# Patient Record
Sex: Female | Born: 1985 | Race: White | Hispanic: No | Marital: Married | Smoking: Former smoker
Health system: Southern US, Community
[De-identification: ages and names within clinical notes are randomized; demographics above are authoritative.]

## PROBLEM LIST (undated history)

## (undated) ENCOUNTER — Inpatient Hospital Stay (HOSPITAL_COMMUNITY): Payer: Self-pay

## (undated) DIAGNOSIS — L03213 Periorbital cellulitis: Secondary | ICD-10-CM

## (undated) DIAGNOSIS — T7840XA Allergy, unspecified, initial encounter: Secondary | ICD-10-CM

## (undated) DIAGNOSIS — R519 Headache, unspecified: Secondary | ICD-10-CM

## (undated) DIAGNOSIS — R51 Headache: Secondary | ICD-10-CM

## (undated) DIAGNOSIS — K589 Irritable bowel syndrome without diarrhea: Secondary | ICD-10-CM

## (undated) DIAGNOSIS — L509 Urticaria, unspecified: Secondary | ICD-10-CM

## (undated) DIAGNOSIS — F419 Anxiety disorder, unspecified: Secondary | ICD-10-CM

## (undated) DIAGNOSIS — K219 Gastro-esophageal reflux disease without esophagitis: Secondary | ICD-10-CM

## (undated) DIAGNOSIS — R569 Unspecified convulsions: Secondary | ICD-10-CM

## (undated) DIAGNOSIS — F329 Major depressive disorder, single episode, unspecified: Secondary | ICD-10-CM

## (undated) DIAGNOSIS — F32A Depression, unspecified: Secondary | ICD-10-CM

## (undated) DIAGNOSIS — G8929 Other chronic pain: Secondary | ICD-10-CM

## (undated) HISTORY — DX: Periorbital cellulitis: L03.213

## (undated) HISTORY — PX: INTRAUTERINE DEVICE INSERTION: SHX323

## (undated) HISTORY — DX: Irritable bowel syndrome without diarrhea: K58.9

## (undated) HISTORY — DX: Other chronic pain: G89.29

## (undated) HISTORY — DX: Headache, unspecified: R51.9

## (undated) HISTORY — DX: Gastro-esophageal reflux disease without esophagitis: K21.9

## (undated) HISTORY — DX: Allergy, unspecified, initial encounter: T78.40XA

## (undated) HISTORY — DX: Headache: R51

## (undated) HISTORY — PX: BREAST ENHANCEMENT SURGERY: SHX7

## (undated) HISTORY — DX: Urticaria, unspecified: L50.9

---

## 2016-08-01 ENCOUNTER — Inpatient Hospital Stay (HOSPITAL_COMMUNITY)
Admission: AD | Admit: 2016-08-01 | Discharge: 2016-08-01 | Disposition: A | Discharge status: Home / Self Care | Payer: Medicaid Other | Source: Ambulatory Visit | Attending: Obstetrics | Admitting: Obstetrics

## 2016-08-01 ENCOUNTER — Encounter (HOSPITAL_COMMUNITY): Payer: Self-pay | Admitting: *Deleted

## 2016-08-01 DIAGNOSIS — O26899 Other specified pregnancy related conditions, unspecified trimester: Secondary | ICD-10-CM

## 2016-08-01 DIAGNOSIS — O26892 Other specified pregnancy related conditions, second trimester: Secondary | ICD-10-CM | POA: Diagnosis not present

## 2016-08-01 DIAGNOSIS — Z87891 Personal history of nicotine dependence: Secondary | ICD-10-CM | POA: Diagnosis not present

## 2016-08-01 DIAGNOSIS — O30032 Twin pregnancy, monochorionic/diamniotic, second trimester: Secondary | ICD-10-CM | POA: Diagnosis not present

## 2016-08-01 DIAGNOSIS — R1030 Lower abdominal pain, unspecified: Secondary | ICD-10-CM | POA: Diagnosis present

## 2016-08-01 DIAGNOSIS — O368120 Decreased fetal movements, second trimester, not applicable or unspecified: Secondary | ICD-10-CM | POA: Insufficient documentation

## 2016-08-01 DIAGNOSIS — R109 Unspecified abdominal pain: Secondary | ICD-10-CM

## 2016-08-01 DIAGNOSIS — O26891 Other specified pregnancy related conditions, first trimester: Secondary | ICD-10-CM

## 2016-08-01 DIAGNOSIS — Z3A17 17 weeks gestation of pregnancy: Secondary | ICD-10-CM | POA: Insufficient documentation

## 2016-08-01 DIAGNOSIS — O219 Vomiting of pregnancy, unspecified: Secondary | ICD-10-CM | POA: Diagnosis not present

## 2016-08-01 DIAGNOSIS — R102 Pelvic and perineal pain: Secondary | ICD-10-CM | POA: Diagnosis not present

## 2016-08-01 HISTORY — DX: Unspecified convulsions: R56.9

## 2016-08-01 HISTORY — DX: Major depressive disorder, single episode, unspecified: F32.9

## 2016-08-01 HISTORY — DX: Anxiety disorder, unspecified: F41.9

## 2016-08-01 HISTORY — DX: Depression, unspecified: F32.A

## 2016-08-01 LAB — URINALYSIS, ROUTINE W REFLEX MICROSCOPIC
Bilirubin Urine: NEGATIVE
Glucose, UA: NEGATIVE mg/dL
Hgb urine dipstick: NEGATIVE
Ketones, ur: NEGATIVE mg/dL
LEUKOCYTES UA: NEGATIVE
NITRITE: NEGATIVE
Protein, ur: NEGATIVE mg/dL
SPECIFIC GRAVITY, URINE: 1.025 (ref 1.005–1.030)
pH: 6 (ref 5.0–8.0)

## 2016-08-01 NOTE — Discharge Instructions (Signed)

## 2016-08-01 NOTE — MAU Provider Note (Signed)
History     CSN: [id number]  Arrival date and time: 08/01/16 1409   First Provider Initiated Contact with Patient 08/01/16 1505      Chief Complaint  Patient presents with  . Abdominal Pain  . Decreased Fetal Movement   HPI Gabrielle Young is 30 y.o. G3P2002 [redacted]w[redacted]d weeks presenting with lower abdominal cramping X 4 days.  The pain is in the lower quadrant, more on the right and with movement.  She is concerned something may be wrong with the fetus on her right.  Neg for abnormal vaginal discharge and bleeding.  Decreased fetal movement.  Has  Mono-Di twin pregnancy. Has anatomy scan schedule for next Monday. She is a patient of Gabrielle Young.  Has been seen once, late to care because of move.     Past Medical History:  Diagnosis Date  . Anxiety   . Depression   . 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 BREAST ENHANCEMENT SURGERY      History reviewed. No pertinent family history.  Social History  Substance Use Topics  . Smoking status: Former Research scientist (life sciences)  . Smokeless tobacco: Never Used  . Alcohol use No    Allergies: Allergies not on file  No prescriptions prior to admission.    Review of Systems  Constitutional: Negative for chills and fever.  Gastrointestinal: Positive for abdominal pain (decreased fetal movement. ), nausea and vomiting.  Genitourinary: Negative for dysuria, frequency, hematuria and urgency.       Negative for vaginal bleeding or abnormal discharge.   Neurological: Negative for headaches.   Physical Exam   Blood pressure 102/72, pulse 84, temperature 98.3 F (36.8 C), resp. rate 16, weight 157 lb (71.2 kg), last menstrual period 03/31/2016.  Physical Exam  Constitutional: She is oriented to person, place, and time. She appears well-developed and well-nourished.  HENT:  Head: Normocephalic.  Cardiovascular: Normal rate.   Respiratory: Effort normal.  GI: There is tenderness (bilaterally in the  area of the round ligaments).  Genitourinary: There is no rash, tenderness or lesion on the right labia. There is no rash, tenderness or lesion on the left labia. Uterus is enlarged (19 week size). Uterus is not tender. Cervix exhibits motion tenderness, discharge and friability. No bleeding in the vagina. No vaginal discharge found.  Neurological: She is alert and oriented to person, place, and time.  Skin: Skin is warm and dry.  Psychiatric: She has a normal mood and affect. Her behavior is normal. Thought content normal.   Results for orders placed or performed during the hospital encounter of 08/01/16 (from the past 24 hour(s))  Urinalysis, Routine w reflex microscopic (not at [facility])     Status: Abnormal   Collection Time: 08/01/16  2:30 PM  Result Value Ref Range   Color, Urine YELLOW YELLOW   APPearance HAZY (A) CLEAR   Specific Gravity, Urine 1.025 1.005 - 1.030   pH 6.0 5.0 - 8.0   Glucose, UA NEGATIVE NEGATIVE mg/dL   Hgb urine dipstick NEGATIVE NEGATIVE   Bilirubin Urine NEGATIVE NEGATIVE   Ketones, ur NEGATIVE NEGATIVE mg/dL   Protein, ur NEGATIVE NEGATIVE mg/dL   Nitrite NEGATIVE NEGATIVE   Leukocytes, UA NEGATIVE NEGATIVE    MAU Course  Procedures  MDM MSE Exam Reported MSE, labs and physical findings.  Dr. Carlis Abbott ordered po challenge.  If no vomiting, continue to encourage Diclegis for nausea.  If she does not keep down fluids, wants  to be called. Care turned over to J. Rasch, NP at 16:05  Assessment and Plan  A: Decreased fetal movement     Twin gestation at [redacted]w[redacted]d     Cramping in second trimester     Round ligament pain    P:  Keep scheduled appt for next week       Patient passed PO challenge in MAU.        Encouraged diclegis for Nausea symptoms   KEY,EVE M 08/01/2016, 3:05 PM   Lezlie Lye, NP 08/01/2016 4:52 PM

## 2016-08-01 NOTE — MAU Note (Signed)
Pt presents to MAU with complaints of lower abdominal cramping since Thursday night. Denies and vaginal bleeding or abnormal discharge. PT states that she has started feeling movement and noted a decrease and is concerned. PNC started late due to moving to [place]. TWIN gestation.

## 2016-08-15 ENCOUNTER — Other Ambulatory Visit (HOSPITAL_COMMUNITY): Payer: Self-pay | Admitting: Obstetrics and Gynecology

## 2016-08-15 DIAGNOSIS — IMO0001 Reserved for inherently not codable concepts without codable children: Secondary | ICD-10-CM

## 2016-08-15 DIAGNOSIS — Z3A20 20 weeks gestation of pregnancy: Secondary | ICD-10-CM

## 2016-08-15 DIAGNOSIS — Z3689 Encounter for other specified antenatal screening: Secondary | ICD-10-CM

## 2016-08-17 ENCOUNTER — Encounter (HOSPITAL_COMMUNITY): Payer: Self-pay | Admitting: Obstetrics and Gynecology

## 2016-08-26 ENCOUNTER — Ambulatory Visit (HOSPITAL_COMMUNITY)
Admission: RE | Admit: 2016-08-26 | Discharge: 2016-08-26 | Disposition: A | Discharge status: Home / Self Care | Payer: Medicaid Other | Source: Ambulatory Visit | Attending: Obstetrics and Gynecology | Admitting: Obstetrics and Gynecology

## 2016-08-26 ENCOUNTER — Other Ambulatory Visit (HOSPITAL_COMMUNITY): Payer: Self-pay | Admitting: Obstetrics and Gynecology

## 2016-08-26 ENCOUNTER — Encounter (HOSPITAL_COMMUNITY): Payer: Self-pay

## 2016-08-26 DIAGNOSIS — Z315 Encounter for genetic counseling: Secondary | ICD-10-CM | POA: Insufficient documentation

## 2016-08-26 DIAGNOSIS — Q6602 Congenital talipes equinovarus, left foot: Secondary | ICD-10-CM

## 2016-08-26 DIAGNOSIS — O358XX0 Maternal care for other (suspected) fetal abnormality and damage, not applicable or unspecified: Secondary | ICD-10-CM | POA: Diagnosis not present

## 2016-08-26 DIAGNOSIS — IMO0001 Reserved for inherently not codable concepts without codable children: Secondary | ICD-10-CM

## 2016-08-26 DIAGNOSIS — Z3A20 20 weeks gestation of pregnancy: Secondary | ICD-10-CM

## 2016-08-26 DIAGNOSIS — O30032 Twin pregnancy, monochorionic/diamniotic, second trimester: Secondary | ICD-10-CM | POA: Diagnosis not present

## 2016-08-26 DIAGNOSIS — O358XX1 Maternal care for other (suspected) fetal abnormality and damage, fetus 1: Secondary | ICD-10-CM

## 2016-08-26 DIAGNOSIS — O35HXX0 Maternal care for other (suspected) fetal abnormality and damage, fetal lower extremities anomalies, not applicable or unspecified: Secondary | ICD-10-CM | POA: Insufficient documentation

## 2016-08-26 DIAGNOSIS — Z3A21 21 weeks gestation of pregnancy: Secondary | ICD-10-CM | POA: Insufficient documentation

## 2016-08-26 DIAGNOSIS — O35HXX1 Maternal care for other (suspected) fetal abnormality and damage, fetal lower extremities anomalies, fetus 1: Secondary | ICD-10-CM

## 2016-08-26 DIAGNOSIS — Z3689 Encounter for other specified antenatal screening: Secondary | ICD-10-CM

## 2016-08-26 DIAGNOSIS — Q6601 Congenital talipes equinovarus, right foot: Secondary | ICD-10-CM | POA: Insufficient documentation

## 2016-08-26 NOTE — Progress Notes (Signed)
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 She was sent for confirmatory Korea, genetic counseling and consult. Pregnancy has been unremarkable so far. Reports good fetal movement from both twins. There is no history of twin or distal extremity anomalies on either side of the family.   OB History: OB History    Gravida Para Term Preterm AB Living   3 2 2     2    SAB TAB Ectopic Multiple Live Births           2      PMH:  Past Medical History:  Diagnosis Date  . Anxiety   . Depression   . 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 BREAST ENHANCEMENT SURGERY     Meds: PNV, diclegis Allergies: NKDA Fh: Noncontributory Soc: Former smoker, no ETOH or illicit drug use  Review of Systems: no vaginal bleeding or cramping/contractions, no LOF, no nausea/vomiting. All other systems reviewed and are negative.  PE: see EPIC section for PE and vitals  Please see separate document for fetal ultrasound report.  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 Further, twin B has mild unilateral pyelectasis (33mm on the right). I cannot unequivocally confirm monochorionic implantation today. I cannot see a "T" sign or a "lambda" sign at the membrane junction. There is a moderately thick membrane. Both twins are boys and the placental mass appears single or possibly  fused. I am assuming her first Korea in your office was more diagnostic than mine, so we will proceed with the assumption that this is a mono/di twin set. To that end, in addition to the "routine" enhanced surveillance  provided for all twin pregnancies. she needs [place] evaluation every 2 weeks to assess for twin-to-twin transfusion. There should be growth evaluation every 4 weeks. On today's scan there is no evidence of TTTS. The pyelectasis should be reevaluated during the course of the pregnancy. A 74mm measurement in the third trimester should prompt referral to pediatric urology. Due to the equinovarus deformity, this couple could also benefit from a third trimester predelivery consult with a pediatric orthopedist.  It should be noted her cervical length today is normal. A repeat evaluation between 24 and 26 weeks is warranted.  Genetic counseling report is provided separately, but in summary she is going to obtain NIPT for aneuploidy and expanded carrier screening  Please let us know if you would like Korea to continue our Korea evaluations for TTTS. If so, please make this patient an appointment for 2 weeks from now in the MFM center.   It is my understanding The patient is moving to [place] within the month and will be transferring her care to a [facility] provider. I let them know that [facility] has EPIC EMR and they will be able to access our reports via Care Anywhere  Thank you for the opportunity to be a part of the care of Gabrielle Young. Please contact our office if we can be of further assistance.   I spent approximately 40 minutes with this patient with over 50% of time spent in face-to-face counseling.

## 2016-08-26 NOTE — Progress Notes (Signed)
Genetic Counseling  High-Risk Gestation Note  Appointment Date:  08/26/2016 Referred By: Gabrielle Kenner, DO Date of Birth:  1986-07-31 Partner: Gabrielle Young   Pregnancy History: [id number] Estimated Date of Delivery: 01/05/17 Estimated Gestational Age: 35w1dAttending: MGriffin Dakin MD  I met with [name] her partner, Mr. Gabrielle Young for genetic counseling because of the ultrasound finding of bilateral club foot for twin B in monochorionic diamniotic twin gestation.   In summary:  Discussed ultrasound findings in detail: bilateral club foot in twin A (formerly was twin B on previous exams, have switched places) and right sided urinary tract dilation (pyelectasis) in twin B  Reviewed options for additional screening  NIPS-elected to pursue today (Teacher, English as a foreign language  Expanded carrier screening - elected to pursue today ([place])  Ongoing ultrasound  Reviewed options for diagnostic testing, including risks, benefits, limitations and alternatives  Declined amniocentesis  Reviewed other explanations for ultrasound findings  Reviewed family history concerns  We began by reviewing the ultrasound in detail. Ultrasound performed today visualized monochorionic diamniotic twin gestation. Twin A was visualized to have bilateral club foot. Twin B was visualized to have right sided urinary tract dilation (pyelectasis), measuring 6 mm. All remaining visualized fetal anatomy was within normal limits. Normal amniotic fluid was visualized. Complete ultrasound results reported under separate cover.   We discussed that clubfoot/feet is a term that actually describes three distinct anomalies (talipes equinovarus, talipes calcaneovalgus, and metatarsus varus) and occurs in 1 in 1000 births. The most common type of clubfeet, talipes equinovarus, is characterized by forefoot adduction with supination, heel varus, and ankle equines, which cannot be brought back to a neutral position. They were  counseled that clubfeet can be an isolated difference, occur as a feature of an underlying syndrome, or the result of neurological impairment. We discussed that the most likely mode of inheritance for nonsyndromic, isolated clubfoot/feet is multifactorial. There is a known genetic component for multifactorial clubfoot, as demonstrated by twin studies; environmental factors also play a role, including infection, drugs, and intrauterine environment (oligohydramnios, fetal positioning). While the majority of cases of clubfoot/feet are isolated, it is a feature in more than 200 known genetic syndromes, including both chromosomal and single gene conditions. We reviewed chromosomes, nondisjunction and the features of Down syndrome, trisomy 161 and 11 We discussed that the risk for other chromosome aberrations is slightly increased (microdeletions, microduplications, insertions, translocations). We reviewed single gene conditions including common inheritance patterns and associated risks for recurrence. We reviewed the normal results of Gabrielle Young's Quad screen and the associated reduction in risks for fetal Down syndrome and open neural tube defects. We reviewed the limitations of Quad screening and the decreased sensitivity in a twin gestation.   Fetal urinary tract dilation (pyelectasis) is defined as the dilatation of the fetal renal pelvis/pelvises due to excess urine. This finding is estimated to occur in 2-3% of fetuses.  The female to female ratio is 2:1.  Typically, babies with mild pyelectasis are born normal and healthy and we are usually unable to determine why this extra fluid is present.  This urine accumulation may regress, stay the same or continue to accumulate.  The more fluid that accumulates, the more likely this fluid could be the result of a compromise in kidney function, an obstruction, or narrowing of the ureters which transport urine out of the body, thus causing backflow of fluid into the kidneys.   Therefore, it is important to follow pyelectasis to make sure it does not become more concerning.  Also, in some cases postnatal evaluation of baby's kidneys may be warranted.  The finding of pyelectasis is associated with an increased risk for fetal aneuploidy, but this risk is highest when other anomalies or fetal differences are visualized.  This couple was counseled regarding the option of noninvasive prenatal screening (NIPS)/cell free DNA (cfDNA) testing. We reviewed that although highly specific and sensitive, this testing is not considered to be diagnostic and does not detect all chromosome aberrations. We reviewed the detection and false positive rates of NIPS (Panorama) as well as the potential associated cost. We then discussed the option of amniocentesis, including the limitations, benefits, and risks. They understand that amniocentesis allows evaluation of the fetal chromosomes, but cannot detect all genetic conditions. Specifically, we discussed that single gene conditions are difficult to diagnose prenatally unless a specific condition is suspected based on additional ultrasound findings or family history. Additionally, we discussed the availability of microarray analysis, which can be performed pre and postnatally. They were counseled that microarray analysis is a molecular based technique in which a test sample of DNA (fetal) is compared to a reference (normal) genome in order to determine if the test sample has any extra or missing genetic information. Microarray analysis allows for the detection of genetic deletions and duplications that are 998 times smaller than those identified by routine chromosome analysis. We discussed that recent publications show that approximately 6% of patients with an abnormal fetal ultrasound and a normal fetal karyotype had a significant microdeletion/microduplication detected by prenatal microarray analysis. After thoughtful consideration, this couple elected to  have NIPS (Panorama) and declined amniocentesis. The results will be available in ~7-10 days.    Regarding single gene conditions, we discussed the option of expanded carrier screening for a select panel of autosomal recessive and X-linked conditions. We discussed that among the conditions on the panel, clubfoot can be a feature of some but is not a feature of others. Additionally, the panel does not assess for all single gene conditions nor all conditions associated with club foot. We discussed that each person is estimated to have 7-10 genes that do not work correctly, meaning that each person is estimated to be a carrier of 7-10 different genetic conditions. They were counseled that the majority of these conditions follow autosomal recessive inheritance. We reviewed that we each have two copies of all of our genes, one inherited from each parent. If one copy of the pair of genes is changed in a way that causes it not to function properly, but the other copy of the gene works, then a person is called a "carrier" for that condition. However, because the other copy of the gene works correctly, the person typically does not have any health problems related to the non-working gene(s). It is only when BOTH copies of the pair of genes do not work correctly that a person will have the condition and the related health problems.   We reviewed that ACOG currently recommends that all patients be offered carrier screening for cystic fibrosis, spinal muscular atrophy and hemoglobinopathies. In addition, they were counseled that there are a variety of genetic screening laboratories that have pan-ethnic, or expanded, carrier screening panels, which evaluate carrier status for a wide range of genetic conditions. Some of these conditions are severe and actionable, but also rare; others occur more commonly, but are less severe. We discussed that testing options range from screening for a single condition to panels of more than  175 autosomal recessive or X-linked genetic conditions.  We reviewed that the prevalence of each condition varies (and often varies with ethnicity). Thus the couples' background risk to be a carrier for each of these various conditions would range, and in some cases be very low or unknown. Similarly, the detection rate varies with each condition and also varies in some cases with ethnicity, ranging from greater than 99% (in the case of hemoglobinopathies) to unknown. We reviewed that a negative carrier screen would thus reduce, but not eliminate the chance to be a carrier for these conditions. For some conditions included on specific pan-ethnic carrier screening panels, the pre-test carrier frequency and/or the detection rate is unknown. Thus, for some conditions, the exact reduction of risk with a negative carrier screening result may not be able to be quantified. We reviewed that in the event that one partner is found to be a carrier for one or more conditions, carrier screening would be available to the partner for those conditions. The couple was provided with written information regarding pan-ethnic carrier screening option including a list of the conditions included on the screening panel. We discussed the risks, benefits, and limitations of carrier screening with the couple.    We discussed the possible results that the tests might provide including: positive, negative, unanticipated, and no result. Finally, they were counseled regarding the cost of each option and potential out of pocket expenses. After thoughtful consideration of their options, Ms. Gabrielle Young elected to have the expanded carrier screening panel through [facility] laboratory. This panel screens for carrier status of 175 hereditary disorders. We discussed that results will be available in approximately 2 weeks.   We discussed the option of meeting with a pediatric orthopedic specialist to discuss expectant management and treatment of  clubfeet. The couple indicated that they are moving to [place], [place] within the next month and plan to transfer OB care to [facility]. They plan to pursue options of meeting with a pediatric orthopedic specialist through [facility] once their care has been transferred. No follow-up appointments were made in our office at this time, given their plans to transfer care to [facility].   They understand that ultrasound, NIPS, and carrier screening cannot diagnose or rule out all birth defects or genetic syndromes. The patient was advised of this limitation and states she still does not want diagnostic testing at this time.   Both family histories were reviewed and found to be contributory for autism in Gabrielle Young's daughter from a previous partner. Her younger daughter, age 48 years old, reportedly has autism of unknown etiology. She was not described to have dysmorphic features, and she reportedly has no additional health concerns. We discussed that autism is part of the spectrum of conditions referred to as Autistic spectrum disorders (ASD). We discussed that ASDs are among the most common neurodevelopmental disorders, with approximately 1 in 68 children meeting criteria for ASD, according to the Centers for Disease Control. Approximately 80% of individuals diagnosed are female. There is strong evidence that genetic factors play a critical role in development of ASD. There have been recent advances in identifying specific genetic causes of ASD, however, there are still many individuals for whom the etiology of the ASD is not known. The majority of individuals with ASD (70-80%) have essential autism. There is strong evidence that genetic factors play a critical role in development of ASD. Some individuals with ASDs are found to have causative differences in karyotype analysis, chromosomal microarray analysis, or single genes. These are more likely to be identified in individuals with  complex autism spectrum disorders.     Once a family  has a child with a diagnosis of ASD, there is a 13.5% chance to have another child with ASD. If the pregnancy is female the chance is approximately 9%, and approximately 26% if the pregnancy is female. We discussed that recurrence risk would be lower for half-siblings but would still be increased above the general population risk.  In the case of an identified genetic cause, recurrence risk estimate may change. In the absence of an identified genetic etiology, prenatal screening or testing would not be available in the current pregnancy for the autism spectrum disorders in the family. Without further information regarding the provided family history, an accurate genetic risk cannot be calculated.   Mr. Bueche reported a female maternal first cousin with spina bifida.  We discussed that spina bifida is a type of open neural tube defects and affects approximately 1 in 500 live births.  We discussed that NTDs occur as an isolated finding, in the majority of cases, in which case multifactorial inheritance is most often suspected. We also discussed that NTDs may occur as a feature of an underlying genetic syndrome or condition.  Approximately 5-10% of individuals who have spina bifida also have an underlying chromosome condition.   If the reported relative had a nonsyndromic, multifactorial NTD, the risk of recurrence for the current pregnancy (fourth degree relatives) would not be increase above the general population risk by the family history.  If however, the relative had an underlying genetic syndrome, the risk of recurrence depends upon the inheritance of the condition.  We reviewed prenatal screening options for ONTDs including MSAFP, detailed ultrasound, and amniocentesis. Further genetic counseling is warranted if more information is obtained.   Ms. Gabrielle Young denied exposure to environmental toxins or chemical agents. She denied the use of alcohol, tobacco or street drugs. She denied significant viral  illnesses during the course of her pregnancy. Her medical and surgical histories were noncontributory.   I counseled this couple regarding the above risks and available options.  The approximate face-to-face time with the genetic counselor was 45 minutes.  Chipper Oman, MS Certified Genetic Counselor 08/26/2016

## 2016-09-01 ENCOUNTER — Telehealth (HOSPITAL_COMMUNITY): Payer: Self-pay | Admitting: MS"

## 2016-09-01 ENCOUNTER — Other Ambulatory Visit (HOSPITAL_COMMUNITY): Payer: Self-pay

## 2016-09-01 ENCOUNTER — Encounter (HOSPITAL_COMMUNITY): Payer: Self-pay

## 2016-09-01 NOTE — Telephone Encounter (Signed)
Called Gabrielle Young to discuss her prenatal cell free DNA test results.  Gabrielle Young had Panorama testing through [facility].  Testing was offered because of ultrasound finding of club foot in one twin.   The patient was identified by name and DOB.  We reviewed that these are within normal limits, showing a less than 1 in 10,000 risk for trisomies 21, 18 and 13, and monosomy Young (Turner syndrome).   Gabrielle Young elected to have cffDNA analysis for 22q11 deletion syndrome, which was also low risk (1 in 13,300). Additionally, NIPS reported monozygosity for the twin gestation based on their analysis. We reviewed that this testing identifies > 99% of pregnancies with trisomy 13, trisomy 89, and sex chromosome trisomies (47,XXX and 47,XXY). The detection rate for trisomy 18 is 96%.  The detection rate for monosomy Young is ~92%.  The false positive rate is <0.1% for all conditions. Testing was also consistent with female fetal sex.  She understands that this testing does not identify all genetic conditions.  All questions were answered to her satisfaction, she was encouraged to call with additional questions or concerns.  Chipper Oman, MS Certified Genetic Counselor 09/01/2016 4:15 PM

## 2016-09-02 ENCOUNTER — Other Ambulatory Visit (HOSPITAL_COMMUNITY): Payer: Self-pay

## 2016-09-02 ENCOUNTER — Telehealth (HOSPITAL_COMMUNITY): Payer: Self-pay | Admitting: MS"

## 2016-09-02 NOTE — Telephone Encounter (Signed)
Called Gabrielle Young to discuss her carrier screening results. Ms. [name] had carrier screening for the expanded panel through [employer] (175 conditions). The patient was identified by name and DOB. We reviewed that the results are negative, indicating that she does not have a detectable gene alteration in any of the genes for which analysis was performed. We reviewed that carrier screening does not detect all carriers of these conditions, but a normal result significantly decreases the likelihood of being a carrier, and therefore, the overall reproductive risk. We reviewed that [employer] sequences most of the genes, which is associated with a high detection rate for carriers, thus a negative screen is very reassuring. 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 She verbalized understanding that prenatal ultrasound and prenatal testing cannot fully determine that clubfoot are isolated in pregnancy. All questions were answered to her satisfaction, she was encouraged to call with additional questions or concerns. ? Chipper Oman, MS Certified Genetic Counselor 09/02/2016 11:55 AM

## 2017-06-06 ENCOUNTER — Encounter (HOSPITAL_COMMUNITY): Payer: Self-pay

## 2017-11-08 ENCOUNTER — Encounter: Payer: Self-pay | Admitting: Internal Medicine

## 2017-11-08 ENCOUNTER — Ambulatory Visit: Payer: Self-pay | Admitting: Internal Medicine

## 2017-11-08 VITALS — BP 122/78 | HR 70 | Resp 12 | Ht 61.0 in | Wt 137.0 lb

## 2017-11-08 DIAGNOSIS — L219 Seborrheic dermatitis, unspecified: Secondary | ICD-10-CM

## 2017-11-08 DIAGNOSIS — L03213 Periorbital cellulitis: Secondary | ICD-10-CM | POA: Insufficient documentation

## 2017-11-08 DIAGNOSIS — R569 Unspecified convulsions: Secondary | ICD-10-CM

## 2017-11-08 DIAGNOSIS — K582 Mixed irritable bowel syndrome: Secondary | ICD-10-CM

## 2017-11-08 MED ORDER — DICYCLOMINE HCL 10 MG PO CAPS: ORAL_CAPSULE | ORAL | 2 refills | Status: DC

## 2017-11-08 MED ORDER — PSYLLIUM 28.3 % PO POWD: ORAL | Status: DC

## 2017-11-08 MED ORDER — KETOCONAZOLE 1 % EX SHAM: MEDICATED_SHAMPOO | CUTANEOUS | 0 refills | Status: DC
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
## 2017-11-08 NOTE — Progress Notes (Signed)
Social Event organiser implemented a new patient screening. Pt disclosed that she has a history of depression and social aniexty and currently getting counseling at the [facility] for treatment. On the screening she disclosed on the Social Determinants of Health that she has not had an issues of lack of food, no place to live or a history or currently dealing with any type of violence. Social Event organiser offered her a Warden/ranger and pt was willing for SWI to do a follow up call to do a check in with her next week.

## 2017-12-30 ENCOUNTER — Encounter: Payer: Self-pay | Admitting: Internal Medicine

## 2017-12-30 DIAGNOSIS — R569 Unspecified convulsions: Secondary | ICD-10-CM | POA: Insufficient documentation

## 2018-01-08 ENCOUNTER — Ambulatory Visit: Payer: Self-pay | Admitting: Internal Medicine

## 2018-01-31 ENCOUNTER — Ambulatory Visit: Payer: Self-pay

## 2018-01-31 ENCOUNTER — Telehealth: Payer: Self-pay

## 2018-01-31 ENCOUNTER — Ambulatory Visit (INDEPENDENT_AMBULATORY_CARE_PROVIDER_SITE_OTHER): Admitting: Allergy and Immunology

## 2018-01-31 ENCOUNTER — Encounter: Payer: Self-pay | Admitting: Allergy and Immunology

## 2018-01-31 VITALS — BP 100/76 | HR 76 | Temp 98.3°F | Resp 20 | Ht 61.5 in | Wt 132.0 lb

## 2018-01-31 DIAGNOSIS — K219 Gastro-esophageal reflux disease without esophagitis: Secondary | ICD-10-CM

## 2018-01-31 DIAGNOSIS — R519 Headache, unspecified: Secondary | ICD-10-CM

## 2018-01-31 DIAGNOSIS — R197 Diarrhea, unspecified: Secondary | ICD-10-CM

## 2018-01-31 DIAGNOSIS — H1010 Acute atopic conjunctivitis, unspecified eye: Secondary | ICD-10-CM | POA: Diagnosis not present

## 2018-01-31 DIAGNOSIS — R51 Headache: Secondary | ICD-10-CM

## 2018-01-31 DIAGNOSIS — J3089 Other allergic rhinitis: Secondary | ICD-10-CM | POA: Diagnosis not present

## 2018-01-31 DIAGNOSIS — R140 Abdominal distension (gaseous): Secondary | ICD-10-CM

## 2018-01-31 DIAGNOSIS — G4720 Circadian rhythm sleep disorder, unspecified type: Secondary | ICD-10-CM | POA: Diagnosis not present

## 2018-01-31 MED ORDER — OLOPATADINE HCL 0.2 % OP SOLN: 1.0000 [drp] | Freq: Every day | OPHTHALMIC | 5 refills | Status: DC

## 2018-01-31 MED ORDER — OLOPATADINE HCL 0.1 % OP SOLN: 1.0000 [drp] | Freq: Two times a day (BID) | OPHTHALMIC | 5 refills | Status: AC

## 2018-01-31 MED ORDER — OMEPRAZOLE 40 MG PO CPDR: 40.0000 mg | DELAYED_RELEASE_CAPSULE | ORAL | 5 refills | Status: AC

## 2018-01-31 MED ORDER — METHYLPREDNISOLONE ACETATE 80 MG/ML IJ SUSP
80.0000 mg | Freq: Once | INTRAMUSCULAR | Status: AC
Start: 2018-01-31 — End: 2018-01-31
  Administered 2018-01-31: 80 mg via INTRAMUSCULAR

## 2018-01-31 MED ORDER — RANITIDINE HCL 300 MG PO CAPS: 300.0000 mg | ORAL_CAPSULE | Freq: Every evening | ORAL | 5 refills | Status: AC

## 2018-01-31 MED ORDER — MONTELUKAST SODIUM 10 MG PO TABS: 10.0000 mg | ORAL_TABLET | Freq: Every day | ORAL | 5 refills | Status: DC

## 2018-01-31 MED ORDER — CYPROHEPTADINE HCL 4 MG PO TABS: 4.0000 mg | ORAL_TABLET | Freq: Every day | ORAL | 5 refills | Status: AC

## 2018-01-31 NOTE — Progress Notes (Signed)
Dear [name],  Thank you for referring Gabrielle Young to the [facility] on 01/31/2018.   Below is a summation of this patient's evaluation and recommendations.  Thank you for your referral. I will keep you informed about this patient's response to treatment.   If you have any questions please do not hesitate to contact me.   Sincerely,  Jiles Prows, MD Allergy / Immunology [facility]   ______________________________________________________________________    NEW PATIENT NOTE  Referring Provider: Alvera Singh, FNP Primary Provider: Mack Hook, MD Date of office visit: 01/31/2018    Subjective:   Chief Complaint:  Gabrielle Young (DOB: Apr 17, 1986) is a 32 y.o. female who presents to the clinic on 01/31/2018 with a chief complaint of No chief complaint on file. [name]     HPI: Gabrielle Young presents to this clinic in evaluation of several different issues.  First, she has a "digestive issue" that has been long-standing.  Apparently since her delivery in February 2018 she has had persistent issues with reflux with regurgitation and nausea even in the face of using ranitidine on a daily basis.  She does find that ranitidine will help short-term but it always wears off as the day goes forward.  In addition, she has issues with bloating especially if she eats potatoes and wheat.  She has had a long history of constipation with a bowel movement 1 time per week and when she does have a bowel movement it is usually diarrhea.  She cannot consume coffee because it gives rise to nausea and she rarely has a soda and she rarely has chocolate and only drinks alcohol on the weekend.  If she drinks beer she does get very bloated in her abdomen.  Apparently she has been checked for celiac disease with a blood test which has been normal.  Second, she has an issue with nasal congestion and  itchy red watery eyes that appears to occur on a perennial basis since she moved from [place] 2 years ago to [place].  Outdoor exposure and exposure to cats and dust give rise to this issue.  She does have a cat that has been introduced inside the household for the past 2 years.  She has tried Flonase in the past which really did not help her very much and she relies on the use of antihistamines and decongestants which slightly helped.  Third, she has a daily headache in her face and around her nose that throbs all day.  She wakes up with a headache and she goes to bed with a headache.  Sometimes her headache is quite severe and she needs to stay home as she does not think that she can function very well if she goes out of the house.  There does not appear to be any scotoma associated with this headache or other neurological symptoms.  It should be noted that her sleep is really not very good.  She takes Benadryl and she will take melatonin to try and help her sleep and even while using these medications she has fractured sleep and she is awakening a lot at nighttime sometimes with gasping and snoring and she feels unrefreshed in the morning.  She has several children at home and she does not have an opportunity to take a nap during the day.  Past Medical History:  Diagnosis Date  . Anxiety   . Cellulitis of  periorbital region of both eyes    Reportedly recurrent--last 2014  . Depression   . 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 Urticaria     Past Surgical History:  Procedure Laterality Date  . BREAST ENHANCEMENT SURGERY    . CESAREAN SECTION  11/2016    Allergies as of 01/31/2018   No Known Allergies     Medication List      diphenhydrAMINE 25 MG tablet Commonly known as:  BENADRYL Take 25 mg by mouth every 6 (six) hours as needed.   loratadine 10 MG tablet Commonly known as:  CLARITIN Take 10 mg by mouth daily.   LYSTEDA 650 MG Tabs tablet Generic drug:  tranexamic  acid Lysteda 650 mg tablet  Take 2 tablets 3 times a day by oral route for 5 days.   ranitidine 150 MG tablet Commonly known as:  ZANTAC Take 150 mg by mouth 2 (two) times daily.       Review of systems negative except as noted in HPI / PMHx or noted below:  Review of Systems  Constitutional: Negative.   HENT: Negative.   Eyes: Negative.   Respiratory: Negative.   Cardiovascular: Negative.   Gastrointestinal: Negative.   Genitourinary: Negative.   Musculoskeletal: Negative.   Skin: Negative.   Neurological: Negative.   Endo/Heme/Allergies: Negative.   Psychiatric/Behavioral: Negative.     Family History  Problem Relation Age of Onset  . Congenital heart disease Brother   . Autism Daughter     Social History   Socioeconomic History  . Marital status: Married    Spouse name: Tuleen Mandelbaum  . Number of children: 4  . Years of education: some college  . Highest education level: Not on file  Occupational History  . Not on file  Social Needs  . Financial resource strain: Not on file  . Food insecurity:    Worry: Never true    Inability: Never true  . Transportation needs:    Medical: No    Non-medical: No  Tobacco Use  . Smoking status: Former Smoker    Packs/day: 0.25    Years: 13.00    Pack years: 3.25    Types: Cigarettes    Last attempt to quit: 05/08/2016    Years since quitting: 1.7  . Smokeless tobacco: Never Used  Substance and Sexual Activity  . Alcohol use: Yes    Comment: occasionally on weekend--glass of wine  . Drug use: No  . Sexual activity: Yes    Birth control/protection: IUD    Comment: paragard  Lifestyle  . Physical activity:    Days per week: Not on file    Minutes per session: Not on file  . Stress: Not at all  Relationships  . Social connections:    Talks on phone: Not on file    Gets together: Not on file    Attends religious service: Not on file    Active member of club or organization: Not on file    Attends meetings of  clubs or organizations: Not on file    Relationship status: Not on file  . Intimate partner violence:    Fear of current or ex partner: No    Emotionally abused: No    Physically abused: No    Forced sexual activity: No  Other Topics Concern  . Not on file  Social History Narrative  . Not on file    Environmental and Social history  Lives in a apartment with a  dry environment, a cat located inside the household, carpet in the bedroom, plastic on the bed, plastic on the pillow, and no smokers located inside the household.  Objective:   Vitals:   01/31/18 0831  BP: 100/76  Pulse: 76  Resp: 20  Temp: 98.3 F (36.8 C)   Height: 5' 1.5" (156.2 cm) Weight: 132 lb (59.9 kg)  Physical Exam  Constitutional:  Allergic shiners, slightly raspy voice  HENT:  Head: Normocephalic. Head is without right periorbital erythema and without left periorbital erythema.  Right Ear: Tympanic membrane, external ear and ear canal normal.  Left Ear: Tympanic membrane, external ear and ear canal normal.  Nose: Mucosal edema present. No rhinorrhea.  Mouth/Throat: Oropharynx is clear and moist and mucous membranes are normal. No oropharyngeal exudate.  Eyes: Pupils are equal, round, and reactive to light. Conjunctivae and lids are normal.  Neck: Trachea normal. No tracheal deviation present. No thyromegaly present.  Cardiovascular: Normal rate, regular rhythm, S1 normal, S2 normal and normal heart sounds.  No murmur heard. Pulmonary/Chest: Effort normal. No stridor. No respiratory distress. She has no wheezes. She has no rales. She exhibits no tenderness.  Abdominal: Soft. She exhibits no distension and no mass. There is no hepatosplenomegaly. There is no tenderness. There is no rebound and no guarding.  Musculoskeletal: She exhibits no edema or tenderness.  Lymphadenopathy:       Head (right side): No tonsillar adenopathy present.       Head (left side): No tonsillar adenopathy present.    She has  no cervical adenopathy.    She has no axillary adenopathy.  Neurological: She is alert.  Skin: No rash noted. She is not diaphoretic. No erythema. No pallor. Nails show no clubbing.    Diagnostics: Allergy skin tests were performed.  She demonstrated hypersensitivity to grasses and weeds and trees and house dust mite and cat and dog  Results of blood tests obtained 27 December 2017 identified WBC 7.2, absolute eosinophil 0, absolute lymphocyte 2400, hemoglobin 12.5, platelet 369, normal hepatic and renal function, TSH 2.32 iU/mL, IgE antibodies directed against multiple aeroallergens including cat, dog, house dust mite, pollens, weeds, and trees.  She also had IgE antibodies directed against peanut at 0.46 KU/L, potato 0.79 KU/L, sesame seed 0.69 KU/L, and wheat at 0.41 KU/L, negative tissue transglutaminase antibody at 1.3U/mL  Assessment and Plan:    1. Other allergic rhinitis   2. Seasonal allergic conjunctivitis   3. Chronic nonintractable headache, unspecified headache type   4. Gastroesophageal reflux disease, esophagitis presence not specified   5. Diarrhea, unspecified type   6. Abdominal bloating   7. Dysfunction of sleep stage or arousal     1.  Allergen avoidance measures  2.  Treat and prevent inflammation:   A.  OTC Nasacort 1 spray each nostril 1 time per day  B.  Montelukast 10 mg tablet 1 time per day  C.  Depo-Medrol 80 IM delivered in clinic today  3.  Treat and prevent headaches and sleep dysfunction:   A.  Periactin 4 mg tablet 1 tablet at bedtime  4.  Treat and prevent reflux:   A.  Omeprazole 40 mg tablet in a.m.  B.  Ranitidine 300 mg in p.m.  5.  If needed:   A.  OTC antihistamine -Claritin/Zyrtec/Allegra  B.  Pataday 1 drop each eye one time per day  6.  Consider a course of immunotherapy  7.  May need upper endoscopy and colonoscopy  8.  Return  to clinic in 3 weeks or earlier if problem  Paityn has several issues that need to be addressed today  including not just her atopic disease giving rise to significant inflammation of her airway and eyes but also her chronic daily headache and sleep dysfunction and reflux.  I have made an attempt to address each 1 of these issues as noted above.  We will see what type of response we get over the course of the next 3 weeks.  She may require an upper endoscopy and colonoscopy for some of her complaints if she does not have a very good response to therapy mentioned above.  We may need to evaluate her for possible sleep apnea given her chronic daily headaches and sleep dysfunction if she does not respond well to the therapy noted above.  I will regroup with her in 3 weeks.  Jiles Prows, MD Allergy / Immunology [facility]

## 2018-01-31 NOTE — Addendum Note (Signed)
Addended by: Lucrezia Starch I on: 01/31/2018 04:29 PM   Modules accepted: Orders

## 2018-01-31 NOTE — Patient Instructions (Addendum)
  1.  Allergen avoidance measures  2.  Treat and prevent inflammation:   A.  OTC Nasacort 1 spray each nostril 1 time per day  B.  Montelukast 10 mg tablet 1 time per day  C.  Depo-Medrol 80 IM delivered in clinic today  3.  Treat and prevent headaches and sleep dysfunction:   A.  Periactin 4 mg tablet 1 tablet at bedtime  4.  Treat and prevent reflux:   A.  Omeprazole 40 mg tablet in a.m.  B.  Ranitidine 300 mg in p.m.  5.  If needed:   A.  OTC antihistamine -Claritin/Zyrtec/Allegra  B.  Pataday 1 drop each eye one time per day  6.  Consider a course of immunotherapy  7.  May need upper endoscopy and colonoscopy  8.  Return to clinic in 3 weeks or earlier if problem

## 2018-01-31 NOTE — Telephone Encounter (Signed)
Patient's insurance will not cover olopatadine 0.2%. The alternatives are olopatadine 0.1%, azelastine, epinastine. Please advise on alternative choice and instructions for patient. Thank you.

## 2018-01-31 NOTE — Telephone Encounter (Signed)
olopatadine 0.1% one drop two times per day

## 2018-01-31 NOTE — Telephone Encounter (Signed)
Prescription has been sent.

## 2018-02-01 ENCOUNTER — Encounter: Payer: Self-pay | Admitting: Allergy and Immunology

## 2018-02-28 ENCOUNTER — Encounter: Payer: Self-pay | Admitting: Allergy and Immunology

## 2018-02-28 ENCOUNTER — Ambulatory Visit (INDEPENDENT_AMBULATORY_CARE_PROVIDER_SITE_OTHER): Admitting: Allergy and Immunology

## 2018-02-28 VITALS — BP 108/80 | HR 64 | Resp 16

## 2018-02-28 DIAGNOSIS — R51 Headache: Secondary | ICD-10-CM

## 2018-02-28 DIAGNOSIS — R140 Abdominal distension (gaseous): Secondary | ICD-10-CM | POA: Diagnosis not present

## 2018-02-28 DIAGNOSIS — H1010 Acute atopic conjunctivitis, unspecified eye: Secondary | ICD-10-CM | POA: Diagnosis not present

## 2018-02-28 DIAGNOSIS — R197 Diarrhea, unspecified: Secondary | ICD-10-CM

## 2018-02-28 DIAGNOSIS — J3089 Other allergic rhinitis: Secondary | ICD-10-CM

## 2018-02-28 DIAGNOSIS — G4720 Circadian rhythm sleep disorder, unspecified type: Secondary | ICD-10-CM | POA: Diagnosis not present

## 2018-02-28 DIAGNOSIS — K219 Gastro-esophageal reflux disease without esophagitis: Secondary | ICD-10-CM

## 2018-02-28 DIAGNOSIS — G8929 Other chronic pain: Secondary | ICD-10-CM

## 2018-02-28 NOTE — Progress Notes (Signed)
Follow-up Note  Referring Provider: Alvera Singh, FNP Primary Provider: Mack Hook, MD Date of Office Visit: 02/28/2018  Subjective:   Gabrielle Young (DOB: 07-01-1986) is a 32 y.o. female who returns to the [facility] on 02/28/2018 in re-evaluation of the following:  HPI: Gabrielle Young returns to this clinic in reevaluation of her allergic rhinoconjunctivitis and chronic headache disorder and reflux and issues tied up with intermittent diarrhea and abdominal bloating and sleep dysfunction.  I last saw her in this clinic during her initial evaluation of 31 January 2018 at which point in time we attempted to address each issue.  She is much better regarding her upper airway congestion.  The cat is now out of the bedroom.  She has been consistently using anti-inflammatory medications for her upper airway.  She is interested in starting a course of immunotherapy.  Her headache frequency has decreased at least 50% and her headaches are less intense.  She does drink 1 coffee per day.  She has been using Periactin 4 mg at bedtime.  Her sleep is a lot better since using Periactin.  However, she still has a slight initiation insomnia and it is difficult for her to turn off her brain when she lays down at night.  She definitely thinks that she has much less fractured sleep at this point.  She feels pretty refreshed in the morning now.  She has had much less reflux at this point.  However, she still has bloating and intermittent diarrhea that appears to occur about 20 minutes after eating.  She continues on omeprazole and ranitidine.  Allergies as of 02/28/2018   No Known Allergies     Medication List      cyproheptadine 4 MG tablet Commonly known as:  PERIACTIN Take 1 tablet (4 mg total) by mouth at bedtime.   diphenhydrAMINE 25 MG tablet Commonly known as:  BENADRYL Take 25 mg by mouth every 6 (six) hours as needed.   loratadine 10 MG tablet Commonly known as:   CLARITIN Take 10 mg by mouth daily.   LYSTEDA 650 MG Tabs tablet Generic drug:  tranexamic acid Lysteda 650 mg tablet  Take 2 tablets 3 times a day by oral route for 5 days.   montelukast 10 MG tablet Commonly known as:  SINGULAIR Take 1 tablet (10 mg total) by mouth at bedtime.   olopatadine 0.1 % ophthalmic solution Commonly known as:  PATANOL Place 1 drop into both eyes 2 (two) times daily.   omeprazole 40 MG capsule Commonly known as:  PRILOSEC Take 1 capsule (40 mg total) by mouth every morning.   ranitidine 300 MG capsule Commonly known as:  ZANTAC Take 1 capsule (300 mg total) by mouth every evening.       Past Medical History:  Diagnosis Date  . Anxiety   . Cellulitis of periorbital region of both eyes    Reportedly recurrent--last 2014  . Depression   . 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 Urticaria     Past Surgical History:  Procedure Laterality Date  . BREAST ENHANCEMENT SURGERY    . CESAREAN SECTION  11/2016    Review of systems negative except as noted in HPI / PMHx or noted below:  Review of Systems  Constitutional: Negative.   HENT: Negative.   Eyes: Negative.   Respiratory: Negative.   Cardiovascular: Negative.   Gastrointestinal: Negative.   Genitourinary: Negative.   Musculoskeletal: Negative.   Skin: Negative.  Neurological: Negative.   Endo/Heme/Allergies: Negative.   Psychiatric/Behavioral: Negative.      Objective:   Vitals:   02/28/18 1125  BP: 108/80  Pulse: 64  Resp: 16          Physical Exam  HENT:  Head: Normocephalic.  Right Ear: Tympanic membrane, external ear and ear canal normal.  Left Ear: Tympanic membrane, external ear and ear canal normal.  Nose: Nose normal. No mucosal edema or rhinorrhea.  Mouth/Throat: Uvula is midline, oropharynx is clear and moist and mucous membranes are normal. No oropharyngeal exudate.  Eyes: Conjunctivae are normal.  Neck: Trachea normal. No tracheal tenderness present.  No tracheal deviation present. No thyromegaly present.  Cardiovascular: Normal rate, regular rhythm, S1 normal, S2 normal and normal heart sounds.  No murmur heard. Pulmonary/Chest: Breath sounds normal. No stridor. No respiratory distress. She has no wheezes. She has no rales.  Musculoskeletal: She exhibits no edema.  Lymphadenopathy:       Head (right side): No tonsillar adenopathy present.       Head (left side): No tonsillar adenopathy present.    She has no cervical adenopathy.  Neurological: She is alert.  Skin: No rash noted. She is not diaphoretic. No erythema. Nails show no clubbing.    Diagnostics: none  Assessment and Plan:   1. Other allergic rhinitis   2. Seasonal allergic conjunctivitis   3. Chronic nonintractable headache, unspecified headache type   4. Gastroesophageal reflux disease, esophagitis presence not specified   5. Abdominal bloating   6. Diarrhea, unspecified type   7. Dysfunction of sleep stage or arousal     1.  Continue to perform Allergen avoidance measures  2.  Continue to Treat and prevent inflammation:   A.  OTC Nasacort 1 spray each nostril 1 time per day  B.  Montelukast 10 mg tablet 1 time per day  3.  Continue to Treat and prevent headaches and sleep dysfunction:   A.  Periactin 4 mg tablet 1 - 1-1/2 - 2 tablets at bedtime  4.  Continue to Treat and prevent reflux:   A.  Omeprazole 40 mg tablet in a.m.  B.  Ranitidine 300 mg in p.m.  C. Visit with gastroenterologist for upper endoscopy  5.  If needed:   A.  OTC antihistamine -Claritin/Zyrtec/Allegra  B.  Pataday 1 drop each eye one time per day  6.  Start a course of immunotherapy  7.  Return to clinic in 12 weeks or earlier if problem  Gabrielle Young is certainly better but still has some lingering issues revolving around not just her atopic disease and her chronic cephalgia and sleep dysfunction but also with her GI issues.  We will continue to have her use anti-inflammatory agents for  her respiratory tract and she is interested in starting a course of immunotherapy and we will get that arranged sometime over the course of the next several weeks.  She has the option of increasing her dose of Periactin to find the dose that works best for her sleep dysfunction and chronic headaches going all the way up to 8 mg at bedtime.  She will continue to use a combination of therapy directed against reflux and I think she would be best served by seeing a gastroenterologist to perform an upper endoscopy and possibly a colonoscopy to make sure she does not have helicobactor pylori infection and celiac disease and other disorders contributing to her GI symptoms.  I will see her back in this clinic in  12 weeks or earlier if there is a problem.  Allena Katz, MD [facility] / [facility]

## 2018-02-28 NOTE — Patient Instructions (Addendum)
  1.  Continue to perform Allergen avoidance measures  2.  Continue to Treat and prevent inflammation:   A.  OTC Nasacort 1 spray each nostril 1 time per day  B.  Montelukast 10 mg tablet 1 time per day  3.  Continue to Treat and prevent headaches and sleep dysfunction:   A.  Periactin 4 mg tablet 1 - 1-1/2 - 2 tablets at bedtime  4.  Continue to Treat and prevent reflux:   A.  Omeprazole 40 mg tablet in a.m.  B.  Ranitidine 300 mg in p.m.  C. Visit with gastroenterologist for upper endoscopy  5.  If needed:   A.  OTC antihistamine -Claritin/Zyrtec/Allegra  B.  Pataday 1 drop each eye one time per day  6.  Start a course of immunotherapy  7.  Return to clinic in 12 weeks or earlier if problem

## 2018-03-13 ENCOUNTER — Encounter: Payer: Self-pay | Admitting: Gastroenterology

## 2018-05-09 ENCOUNTER — Encounter: Payer: Self-pay | Admitting: Gastroenterology

## 2018-05-09 ENCOUNTER — Ambulatory Visit: Admitting: Gastroenterology

## 2018-05-09 VITALS — BP 104/60 | HR 68 | Ht 61.5 in | Wt 131.4 lb

## 2018-05-09 DIAGNOSIS — R109 Unspecified abdominal pain: Secondary | ICD-10-CM

## 2018-05-09 DIAGNOSIS — R197 Diarrhea, unspecified: Secondary | ICD-10-CM | POA: Diagnosis not present

## 2018-05-09 DIAGNOSIS — R112 Nausea with vomiting, unspecified: Secondary | ICD-10-CM

## 2018-05-09 MED ORDER — PEG 3350-KCL-NA BICARB-NACL 420 G PO SOLR
4000.0000 mL | ORAL | 0 refills | Status: DC
Start: 2018-05-09 — End: 2018-05-23

## 2018-05-09 NOTE — Patient Instructions (Addendum)
   You will be set up for an ultrasound. You will be set up for an upper endoscopy. You will be set up for a colonoscopy. These are for abdominal pain, diarrhea, vomiting.  You have been scheduled for an abdominal ultrasound at [facility] (1st floor of hospital) on 05/14/18 at 9am. Please arrive 15 minutes prior to your appointment for registration. Make certain not to have anything to eat or drink 6 hours prior to your appointment. Should you need to reschedule your appointment, please contact radiology at [phone]. This test typically takes about 30 minutes to perform.  Normal BMI (Body Mass Index- based on height and weight) is between 19 and 25. Your BMI today is Body mass index is 24.43 kg/m. [name] Please consider follow up  regarding your BMI with your Primary Care Provider.

## 2018-05-09 NOTE — Progress Notes (Signed)
HPI: This is a very pleasant 32 year old woman who was referred to me by Mack Hook, MD  to evaluate abdominal pain, nausea, vomiting, diarrhea.    Chief complaint is abdominal pain, nausea, vomiting, diarrhea  Bloating for years.  Last year things got worse (twins delivered 11/2016).  Nausea/vomting.  20-30 min after eating will have to have very loose stools or vomit.  abouyt 1/2 time she will have lower abdominal pain.    Never has normal bowels, will have tarry black stools occosionally.  Takes tylenol or ibuprofen  Weight fluctuates  Tried zantac last year, helped somewhat.  Went to see an allergist about gluten.    Old Data Reviewed:  Labs done through a [facility] facility March 2019 CBC, complete metabolic profile, celiac sprue testing were all normal, negative   Review of systems: Pertinent positive and negative review of systems were noted in the above HPI section. All other review negative.   Past Medical History:  Diagnosis Date  . Anxiety   . Cellulitis of periorbital region of both eyes    Reportedly recurrent--last 2014  . Chronic headaches   . Depression   . GERD (gastroesophageal reflux disease)   . Irritable bowel syndrome   . 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 Urticaria     Past Surgical History:  Procedure Laterality Date  . BREAST ENHANCEMENT SURGERY    . CESAREAN SECTION  11/2016  . INTRAUTERINE DEVICE INSERTION      Current Outpatient Medications  Medication Sig Dispense Refill  . cyproheptadine (PERIACTIN) 4 MG tablet Take 1 tablet (4 mg total) by mouth at bedtime. 30 tablet 5  . diphenhydrAMINE (BENADRYL) 25 MG tablet Take 25 mg by mouth every 6 (six) hours as needed.    . loratadine (CLARITIN) 10 MG tablet Take 10 mg by mouth daily.    . montelukast (SINGULAIR) 10 MG tablet Take 1 tablet (10 mg total) by mouth at bedtime. 30 tablet 5  . olopatadine (PATANOL) 0.1 % ophthalmic solution Place 1 drop into both eyes 2 (two) times  daily. 5 mL 5  . omeprazole (PRILOSEC) 40 MG capsule Take 1 capsule (40 mg total) by mouth every morning. 30 capsule 5  . ranitidine (ZANTAC) 300 MG capsule Take 1 capsule (300 mg total) by mouth every evening. 30 capsule 5  . tranexamic acid (LYSTEDA) 650 MG TABS tablet Lysteda 650 mg tablet  Take 2 tablets 3 times a day by oral route for 5 days.     No current facility-administered medications for this visit.     Allergies as of 05/09/2018  . (No Known Allergies)    Family History  Problem Relation Age of Onset  . Congenital heart disease Brother   . Autism Daughter   . Diabetes Paternal Grandmother   . Diabetes Paternal Aunt   . Kidney disease Paternal Aunt   . Liver disease Paternal Uncle        x 2  . Breast cancer Maternal Aunt        x 2  . Colon cancer Neg Hx   . Esophageal cancer Neg Hx   . Pancreatic cancer Neg Hx   . Stomach cancer Neg Hx     Social History   Socioeconomic History  . Marital status: Married    Spouse name: Zenola Dezarn  . Number of children: 4  . Years of education: some college  . Highest education level: Not on file  Occupational History  . Occupation: homemaker  Social Needs  . Financial resource strain: Not on file  . Food insecurity:    Worry: Never true    Inability: Never true  . Transportation needs:    Medical: No    Non-medical: No  Tobacco Use  . Smoking status: Former Smoker    Packs/day: 0.25    Years: 13.00    Pack years: 3.25    Types: Cigarettes    Last attempt to quit: 05/08/2016    Years since quitting: 2.0  . Smokeless tobacco: Never Used  Substance and Sexual Activity  . Alcohol use: Yes    Comment: occasionally on weekend--glass of wine (2-4 glasses)  . Drug use: No  . Sexual activity: Yes    Birth control/protection: IUD    Comment: paragard  Lifestyle  . Physical activity:    Days per week: Not on file    Minutes per session: Not on file  . Stress: Not at all  Relationships  . Social connections:     Talks on phone: Not on file    Gets together: Not on file    Attends religious service: Not on file    Active member of club or organization: Not on file    Attends meetings of clubs or organizations: Not on file    Relationship status: Not on file  . Intimate partner violence:    Fear of current or ex partner: No    Emotionally abused: No    Physically abused: No    Forced sexual activity: No  Other Topics Concern  . Not on file  Social History Narrative  . Not on file     Physical Exam: Ht 5' 1.5" (1.562 m)   Wt 131 lb 6.4 oz (59.6 kg)   BMI 24.43 kg/m  Constitutional: generally well-appearing Psychiatric: alert and oriented x3 Eyes: extraocular movements intact Mouth: oral pharynx moist, no lesions Neck: supple no lymphadenopathy Cardiovascular: heart regular rate and rhythm Lungs: clear to auscultation bilaterally Abdomen: soft, nontender, nondistended, no obvious ascites, no peritoneal signs, normal bowel sounds Extremities: no lower extremity edema bilaterally Skin: no lesions on visible extremities   Assessment and plan: 32 y.o. female with chronic postprandial abdominal pains, nausea, vomiting, diarrhea with intermittent dark stools.  Unclear etiology.  Not classic for biliary symptoms but I think some of this could be biliary related and so I am ordering an abdominal ultrasound to check for gallstones.  Lab testing at a [facility] facility 3 months ago was reassuring, CBC, complete med about profile and celiac sprue testing were all negative.  I recommended an upper endoscopy and a colonoscopy for her overall symptoms.  I do not think she needs any further blood tests or imaging studies besides the ultrasound prior to then.    Please see the "Patient Instructions" section for addition details about the plan.   Owens Loffler, MD [facility] 05/09/2018, 10:46 AM  Cc: Mack Hook, MD

## 2018-05-14 ENCOUNTER — Ambulatory Visit (HOSPITAL_COMMUNITY)

## 2018-05-17 ENCOUNTER — Ambulatory Visit (HOSPITAL_COMMUNITY)
Admission: RE | Admit: 2018-05-17 | Discharge: 2018-05-17 | Disposition: A | Discharge status: Home / Self Care | Source: Ambulatory Visit | Attending: Gastroenterology | Admitting: Gastroenterology

## 2018-05-17 DIAGNOSIS — R197 Diarrhea, unspecified: Secondary | ICD-10-CM | POA: Diagnosis present

## 2018-05-17 DIAGNOSIS — R109 Unspecified abdominal pain: Secondary | ICD-10-CM

## 2018-05-17 DIAGNOSIS — R112 Nausea with vomiting, unspecified: Secondary | ICD-10-CM | POA: Insufficient documentation

## 2018-05-23 ENCOUNTER — Encounter: Payer: Self-pay | Admitting: Allergy and Immunology

## 2018-05-23 ENCOUNTER — Ambulatory Visit (INDEPENDENT_AMBULATORY_CARE_PROVIDER_SITE_OTHER): Admitting: Allergy and Immunology

## 2018-05-23 VITALS — BP 118/88 | HR 80 | Resp 16

## 2018-05-23 DIAGNOSIS — R519 Headache, unspecified: Secondary | ICD-10-CM

## 2018-05-23 DIAGNOSIS — J3089 Other allergic rhinitis: Secondary | ICD-10-CM

## 2018-05-23 DIAGNOSIS — R51 Headache: Secondary | ICD-10-CM

## 2018-05-23 DIAGNOSIS — H1010 Acute atopic conjunctivitis, unspecified eye: Secondary | ICD-10-CM | POA: Diagnosis not present

## 2018-05-23 DIAGNOSIS — K219 Gastro-esophageal reflux disease without esophagitis: Secondary | ICD-10-CM

## 2018-05-23 DIAGNOSIS — G4720 Circadian rhythm sleep disorder, unspecified type: Secondary | ICD-10-CM

## 2018-05-23 NOTE — Progress Notes (Signed)
Follow-up Note  Referring Provider: Mack Hook, MD Primary Provider: Mack Hook, MD Date of Office Visit: 05/23/2018  Subjective:   Gabrielle Young (DOB: [birth year]) is a 32 y.o. female who returns to the [facility] on 05/23/2018 in re-evaluation of the following:  HPI: Gabrielle Young returns to this clinic in reevaluation of her allergic rhinoconjunctivitis, chronic headache disorder, reflux, sleep dysfunction, and issues with intermittent abdominal bloating and diarrhea.  I last saw her in this clinic on 28 Feb 2018.  Fortunately, all her headaches have discontinued.  She rarely has any caffeinated drinks at this point and has been using Periactin 4 mg at bedtime.  As well, her sleep is really going quite well on her current plan.  Her nose is still somewhat congested.  She has not been consistently using Nasacort.  She still continues to have some issues with bloating and intermittent diarrhea and some occasional reflux even though she consistently uses omeprazole and ranitidine.  She has an appointment for an upper endoscopy and colonoscopy on September 11 with a gastroenterologist.  Allergies as of 05/23/2018   No Known Allergies     Medication List      cyproheptadine 4 MG tablet Commonly known as:  PERIACTIN Take 1 tablet (4 mg total) by mouth at bedtime.   diphenhydrAMINE 25 MG tablet Commonly known as:  BENADRYL Take 25 mg by mouth every 6 (six) hours as needed.   loratadine 10 MG tablet Commonly known as:  CLARITIN Take 10 mg by mouth daily.   LYSTEDA 650 MG Tabs tablet Generic drug:  tranexamic acid Lysteda 650 mg tablet  Take 2 tablets 3 times a day by oral route for 5 days.   montelukast 10 MG tablet Commonly known as:  SINGULAIR Take 1 tablet (10 mg total) by mouth at bedtime.   olopatadine 0.1 % ophthalmic solution Commonly known as:  PATANOL Place 1 drop into both eyes 2 (two) times daily.   omeprazole 40 MG  capsule Commonly known as:  PRILOSEC Take 1 capsule (40 mg total) by mouth every morning.   ranitidine 300 MG capsule Commonly known as:  ZANTAC Take 1 capsule (300 mg total) by mouth every evening.       Past Medical History:  Diagnosis Date  . Anxiety   . Cellulitis of periorbital region of both eyes    Reportedly recurrent--last 2014  . Chronic headaches   . Depression   . GERD (gastroesophageal reflux disease)   . Irritable bowel syndrome   . 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 Urticaria     Past Surgical History:  Procedure Laterality Date  . BREAST ENHANCEMENT SURGERY    . CESAREAN SECTION  11/2016  . INTRAUTERINE DEVICE INSERTION      Review of systems negative except as noted in HPI / PMHx or noted below:  Review of Systems  Constitutional: Negative.   HENT: Negative.   Eyes: Negative.   Respiratory: Negative.   Cardiovascular: Negative.   Gastrointestinal: Negative.   Genitourinary: Negative.   Musculoskeletal: Negative.   Skin: Negative.   Neurological: Negative.   Endo/Heme/Allergies: Negative.   Psychiatric/Behavioral: Negative.      Objective:   Vitals:   05/23/18 1038  BP: 118/88  Pulse: 80  Resp: 16          Physical Exam  HENT:  Head: Normocephalic.  Right Ear: Tympanic membrane, external ear and ear canal normal.  Left Ear: Tympanic membrane, external  ear and ear canal normal.  Nose: Nose normal. No mucosal edema or rhinorrhea.  Mouth/Throat: Uvula is midline, oropharynx is clear and moist and mucous membranes are normal. No oropharyngeal exudate.  Eyes: Conjunctivae are normal.  Neck: Trachea normal. No tracheal tenderness present. No tracheal deviation present. No thyromegaly present.  Cardiovascular: Normal rate, regular rhythm, S1 normal, S2 normal and normal heart sounds.  No murmur heard. Pulmonary/Chest: Breath sounds normal. No stridor. No respiratory distress. She has no wheezes. She has no rales.   Musculoskeletal: She exhibits no edema.  Lymphadenopathy:       Head (right side): No tonsillar adenopathy present.       Head (left side): No tonsillar adenopathy present.    She has no cervical adenopathy.  Neurological: She is alert.  Skin: No rash noted. She is not diaphoretic. No erythema. Nails show no clubbing.    Diagnostics: none  Assessment and Plan:   1. Other allergic rhinitis   2. Seasonal allergic conjunctivitis   3. Chronic nonintractable headache, unspecified headache type   4. Dysfunction of sleep stage or arousal   5. Gastroesophageal reflux disease, esophagitis presence not specified     1.  Continue to perform Allergen avoidance measures  2.  Continue to Treat and prevent inflammation:   A.  OTC Nasacort 1 spray each nostril 1 time per day  B.  Montelukast 10 mg tablet 1 time per day  3.  Continue to Treat and prevent headaches and sleep dysfunction:   A.  Periactin 4 mg tablet 1 tablet at bedtime  4.  Continue to Treat and prevent reflux:   A.  Omeprazole 40 mg tablet in a.m.  B.  Ranitidine 300 mg in p.m.  C.  September 11th gastroenterologist for upper endoscopy  5.  If needed:   A.  OTC antihistamine -Claritin/Zyrtec/Allegra  B.  Pataday 1 drop each eye one time per day  6.  Recommend starting a course of immunotherapy  7.  Return to clinic in December 2019 or earlier if problem  8.  Obtain full flu vaccine  Gabrielle Young appears to be doing relatively well with her atopic respiratory disease although this is certainly active even in the face of utilizing medications on a pretty consistent basis.  She would definitely be a candidate for immunotherapy and she is presently considering this option.  Her headaches and sleep dysfunction are under pretty good control at this time with Periactin.  Obviously she we will require further evaluation with her gastroenterologist for her other GI issues.  I will see her back in this clinic in December 2019 or  earlier if there is a problem.  Allena Katz, MD [facility]

## 2018-05-23 NOTE — Patient Instructions (Signed)
  1.  Continue to perform Allergen avoidance measures  2.  Continue to Treat and prevent inflammation:   A.  OTC Nasacort 1 spray each nostril 1 time per day  B.  Montelukast 10 mg tablet 1 time per day  3.  Continue to Treat and prevent headaches and sleep dysfunction:   A.  Periactin 4 mg tablet 1 tablet at bedtime  4.  Continue to Treat and prevent reflux:   A.  Omeprazole 40 mg tablet in a.m.  B.  Ranitidine 300 mg in p.m.  C.  September 11th gastroenterologist for upper endoscopy  5.  If needed:   A.  OTC antihistamine -Claritin/Zyrtec/Allegra  B.  Pataday 1 drop each eye one time per day  6.  Recommend starting a course of immunotherapy  7.  Return to clinic in December 2019 or earlier if problem  8.  Obtain full flu vaccine

## 2018-05-24 ENCOUNTER — Encounter: Payer: Self-pay | Admitting: Allergy and Immunology

## 2018-07-04 ENCOUNTER — Encounter: Admitting: Gastroenterology

## 2018-07-12 ENCOUNTER — Ambulatory Visit (AMBULATORY_SURGERY_CENTER): Admitting: Gastroenterology

## 2018-07-12 ENCOUNTER — Encounter: Payer: Self-pay | Admitting: Gastroenterology

## 2018-07-12 VITALS — BP 122/78 | HR 63 | Temp 98.9°F | Resp 20 | Ht 61.5 in | Wt 131.0 lb

## 2018-07-12 DIAGNOSIS — K635 Polyp of colon: Secondary | ICD-10-CM

## 2018-07-12 DIAGNOSIS — K2990 Gastroduodenitis, unspecified, without bleeding: Secondary | ICD-10-CM | POA: Diagnosis not present

## 2018-07-12 DIAGNOSIS — K2970 Gastritis, unspecified, without bleeding: Secondary | ICD-10-CM

## 2018-07-12 DIAGNOSIS — D123 Benign neoplasm of transverse colon: Secondary | ICD-10-CM

## 2018-07-12 DIAGNOSIS — R197 Diarrhea, unspecified: Secondary | ICD-10-CM | POA: Diagnosis not present

## 2018-07-12 DIAGNOSIS — R109 Unspecified abdominal pain: Secondary | ICD-10-CM | POA: Diagnosis not present

## 2018-07-12 DIAGNOSIS — K2950 Unspecified chronic gastritis without bleeding: Secondary | ICD-10-CM | POA: Diagnosis not present

## 2018-07-12 MED ORDER — SODIUM CHLORIDE 0.9 % IV SOLN: 500.0000 mL | Freq: Once | INTRAVENOUS | Status: DC

## 2018-07-12 NOTE — Patient Instructions (Signed)
YOU HAD AN ENDOSCOPIC PROCEDURE TODAY AT [facility]:   Refer to the procedure report that was given to you for any specific questions about what was found during the examination.  If the procedure report does not answer your questions, please call your gastroenterologist to clarify.  If you requested that your care partner not be given the details of your procedure findings, then the procedure report has been included in a sealed envelope for you to review at your convenience later.  YOU SHOULD EXPECT: Some feelings of bloating in the abdomen. Passage of more gas than usual.  Walking can help get rid of the air that was put into your GI tract during the procedure and reduce the bloating. If you had a lower endoscopy (such as a colonoscopy or flexible sigmoidoscopy) you may notice spotting of blood in your stool or on the toilet paper. If you underwent a bowel prep for your procedure, you may not have a normal bowel movement for a few days.  Please Note:  You might notice some irritation and congestion in your nose or some drainage.  This is from the oxygen used during your procedure.  There is no need for concern and it should clear up in a day or so.  SYMPTOMS TO REPORT IMMEDIATELY:   Following lower endoscopy (colonoscopy or flexible sigmoidoscopy):  Excessive amounts of blood in the stool  Significant tenderness or worsening of abdominal pains  Swelling of the abdomen that is new, acute  Fever of 100F or higher   Following upper endoscopy (EGD)  Vomiting of blood or coffee ground material  New chest pain or pain under the shoulder blades  Painful or persistently difficult swallowing  New shortness of breath  Fever of 100F or higher  Black, tarry-looking stools  For urgent or emergent issues, a gastroenterologist can be reached at any hour by calling [phone].   DIET:  We do recommend a small meal at first, but then you may proceed to your regular diet.  Drink  plenty of fluids but you should avoid alcoholic beverages for 24 hours.  ACTIVITY:  You should plan to take it easy for the rest of today and you should NOT DRIVE or use heavy machinery until tomorrow (because of the sedation medicines used during the test).    FOLLOW UP: Our staff will call the number listed on your records the next business day following your procedure to check on you and address any questions or concerns that you may have regarding the information given to you following your procedure. If we do not reach you, we will leave a message.  However, if you are feeling well and you are not experiencing any problems, there is no need to return our call.  We will assume that you have returned to your regular daily activities without incident.  If any biopsies were taken you will be contacted by phone or by letter within the next 1-3 weeks.  Please call us at [phone] if you have not heard about the biopsies in 3 weeks.    SIGNATURES/CONFIDENTIALITY: You and/or your care partner have signed paperwork which will be entered into your electronic medical record.  These signatures attest to the fact that that the information above on your After Visit Summary has been reviewed and is understood.  Full responsibility of the confidentiality of this discharge information lies with you and/or your care-partner.    Handouts were given to your care partner on polyps  and gastritis. You may resume your current medications today. Await biopsy results. Please call if any questions or concerns.

## 2018-07-12 NOTE — Progress Notes (Signed)
No problems noted in the recovery room. maw 

## 2018-07-12 NOTE — Op Note (Signed)
[facility] Patient Name: Gabrielle Young Procedure Date: 07/12/2018 2:26 PM MRN: [id number] Endoscopist: Milus Banister , MD Age: 32 Referring MD:  Date of Birth: 04-Mar-1986 Gender: Female Account #: [id number] Procedure:                Upper GI endoscopy Indications:              Generalized abdominal pain, Functional Dyspepsia Medicines:                Monitored Anesthesia Care Procedure:                Pre-Anesthesia Assessment:                           - Prior to the procedure, a History and Physical                            was performed, and patient medications and                            allergies were reviewed. The patient's tolerance of                            previous anesthesia was also reviewed. The risks                            and benefits of the procedure and the sedation                            options and risks were discussed with the patient.                            All questions were answered, and informed consent                            was obtained. Prior Anticoagulants: The patient has                            taken no previous anticoagulant or antiplatelet                            agents. ASA Grade Assessment: II - A patient with                            mild systemic disease. After reviewing the risks                            and benefits, the patient was deemed in                            satisfactory condition to undergo the procedure.                           After obtaining informed consent, the endoscope was  passed under direct vision. Throughout the                            procedure, the patient's blood pressure, pulse, and                            oxygen saturations were monitored continuously. The                            Endoscope was introduced through the mouth, and                            advanced to the second part of duodenum. The upper                            GI  endoscopy was accomplished without difficulty.                            The patient tolerated the procedure well. Scope In: Scope Out: Findings:                 Mild inflammation characterized by erythema and                            granularity was found in the gastric antrum.                            Biopsies were taken with a cold forceps for                            histology.                           The exam was otherwise without abnormality. Complications:            No immediate complications. Estimated blood loss:                            None. Estimated Blood Loss:     Estimated blood loss: none. Impression:               - Mild, non-specific gastritis. Biopsied.                           - The examination was otherwise normal. Recommendation:           - Patient has a contact number available for                            emergencies. The signs and symptoms of potential                            delayed complications were discussed with the                            patient. Return to normal activities tomorrow.  Written discharge instructions were provided to the                            patient.                           - Resume previous diet.                           - Continue present medications.                           - Await pathology results. Milus Banister, MD 07/12/2018 3:01:38 PM This report has been signed electronically.

## 2018-07-12 NOTE — Progress Notes (Signed)
Called to room to assist during endoscopic procedure.  Patient ID and intended procedure confirmed with present staff. Received instructions for my participation in the procedure from the performing physician.  

## 2018-07-12 NOTE — Op Note (Signed)
[facility] Patient Name: Gabrielle Young Procedure Date: 07/12/2018 2:27 PM MRN: [id number] Endoscopist: Milus Banister , MD Age: 32 Referring MD:  Date of Birth: [birth year] Gender: Female Account #: [id number] Procedure:                Colonoscopy Indications:              Generalized abdominal pain, Chronic diarrhea Medicines:                Monitored Anesthesia Care Procedure:                Pre-Anesthesia Assessment:                           - Prior to the procedure, a History and Physical                            was performed, and patient medications and                            allergies were reviewed. The patient's tolerance of                            previous anesthesia was also reviewed. The risks                            and benefits of the procedure and the sedation                            options and risks were discussed with the patient.                            All questions were answered, and informed consent                            was obtained. Prior Anticoagulants: The patient has                            taken no previous anticoagulant or antiplatelet                            agents. ASA Grade Assessment: II - A patient with                            mild systemic disease. After reviewing the risks                            and benefits, the patient was deemed in                            satisfactory condition to undergo the procedure.                           After obtaining informed consent, the colonoscope  was passed under direct vision. Throughout the                            procedure, the patient's blood pressure, pulse, and                            oxygen saturations were monitored continuously. The                            Colonoscope was introduced through the anus and                            advanced to the the terminal ileum. The colonoscopy                            was performed  without difficulty. The patient                            tolerated the procedure well. The quality of the                            bowel preparation was good. The terminal ileum,                            ileocecal valve, appendiceal orifice, and rectum                            were photographed. Scope In: 2:32:42 PM Scope Out: 2:50:21 PM Scope Withdrawal Time: 0 hours 13 minutes 58 seconds  Total Procedure Duration: 0 hours 17 minutes 39 seconds  Findings:                 The terminal ileum appeared normal.                           A 13 mm polyp was found in the hepatic flexure. The                            polyp was sessile. The polyp was removed with a                            piecemeal technique using a cold snare. Resection                            and retrieval were complete. The site was tattooed                            with a single submucosal injection of [place] ink.                           The exam was otherwise without abnormality on                            direct and retroflexion views.  Biopsies for histology were taken with a cold                            forceps from the entire colon for evaluation of                            microscopic colitis. Complications:            No immediate complications. Estimated blood loss:                            None. Estimated Blood Loss:     Estimated blood loss: none. Impression:               - The examined portion of the ileum was normal.                           - One 13 mm polyp at the hepatic flexure, removed                            piecemeal using a cold snare. Resected and                            retrieved. Tattooed.                           - The examination was otherwise normal on direct                            and retroflexion views.                           - Biopsies were taken with a cold forceps from the                            entire colon for evaluation  of microscopic colitis. Recommendation:           - Patient has a contact number available for                            emergencies. The signs and symptoms of potential                            delayed complications were discussed with the                            patient. Return to normal activities tomorrow.                            Written discharge instructions were provided to the                            patient.                           - Resume previous diet.                           -  Continue present medications.                           - Await pathology results. Milus Banister, MD 07/12/2018 2:53:35 PM This report has been signed electronically.

## 2018-07-12 NOTE — Progress Notes (Signed)
A/ox3 pleased with MAC, report to RN 

## 2018-07-13 ENCOUNTER — Telehealth: Payer: Self-pay

## 2018-07-13 NOTE — Telephone Encounter (Signed)
  Follow up Call-  Call back number 07/12/2018  Post procedure Call Back phone  # [phone]  Permission to leave phone message Yes     Patient questions:  Do you have a fever, pain , or abdominal swelling? No. Pain Score  0 *  Have you tolerated food without any problems? Yes.    Have you been able to return to your normal activities? Yes.    Do you have any questions about your discharge instructions: Diet   No. Medications  No. Follow up visit  No.  Do you have questions or concerns about your Care? Yes.    Actions: * If pain score is 4 or above: No action needed, pain <4.

## 2018-10-02 ENCOUNTER — Ambulatory Visit: Admitting: Allergy and Immunology

## 2018-11-06 ENCOUNTER — Other Ambulatory Visit: Payer: Self-pay | Admitting: Allergy and Immunology

## 2018-12-27 ENCOUNTER — Telehealth: Payer: Self-pay

## 2018-12-27 NOTE — Telephone Encounter (Signed)
I left a voiceamail to let the patient know her referral was expiring on 01/02/2019. I informed her to give me a call to schedule a new appt and request more visits.

## 2019-01-10 ENCOUNTER — Encounter: Payer: Self-pay | Admitting: Gastroenterology

## 2019-03-29 ENCOUNTER — Telehealth: Payer: Self-pay | Admitting: Gastroenterology

## 2019-03-29 NOTE — Telephone Encounter (Signed)
The pt has been advised that she did have a biopsy and it was normal

## 2020-04-30 IMAGING — US US ABDOMEN COMPLETE
1 series · 14 of 25 positions shown · non-contrast
Comparison: None.

CLINICAL DATA: Abdominal pain for 1 month

EXAM:
ABDOMEN ULTRASOUND COMPLETE

[Series 1: us abdomen complete · 14 of 102 slices shown]
[im 1/102]
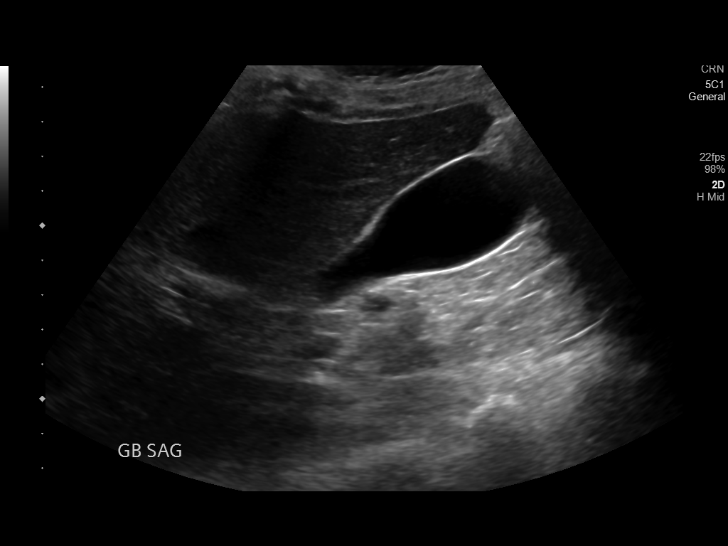
[im 9/102]
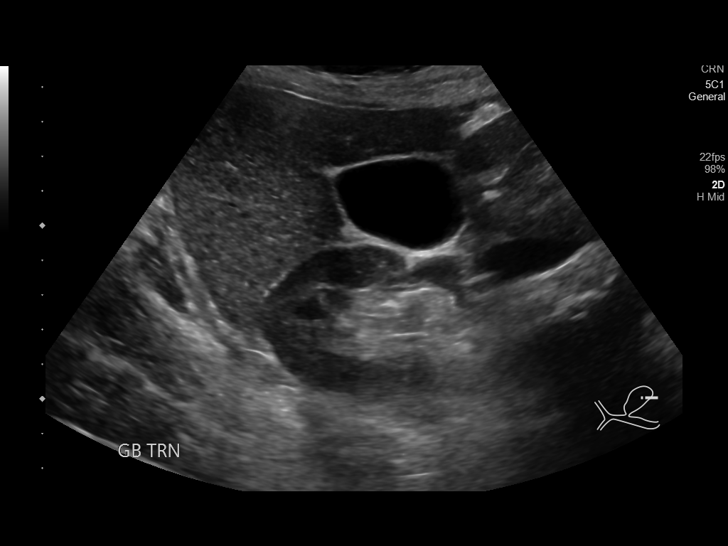
[im 17/102]
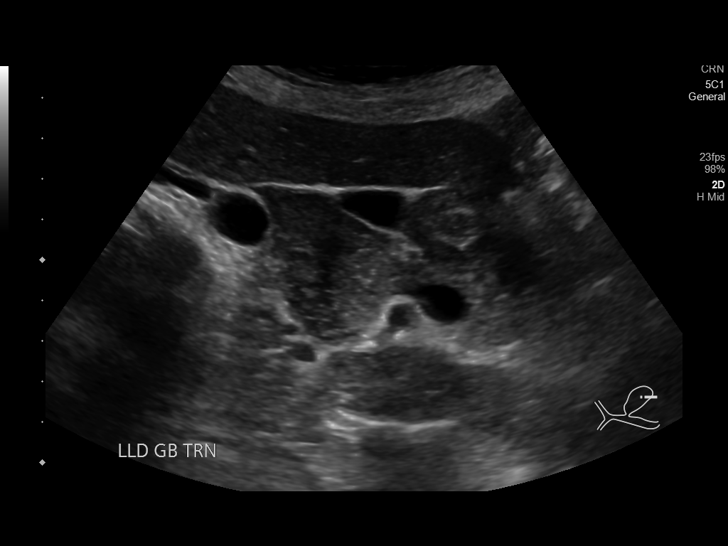
[im 26/102]
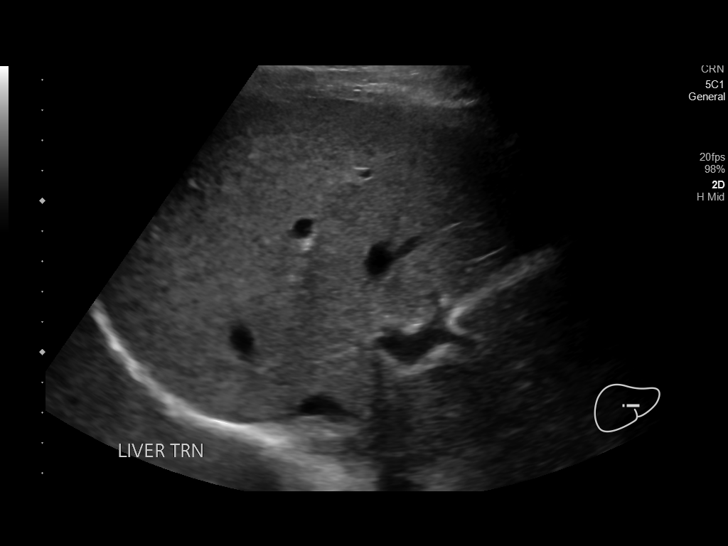
[im 34/102]
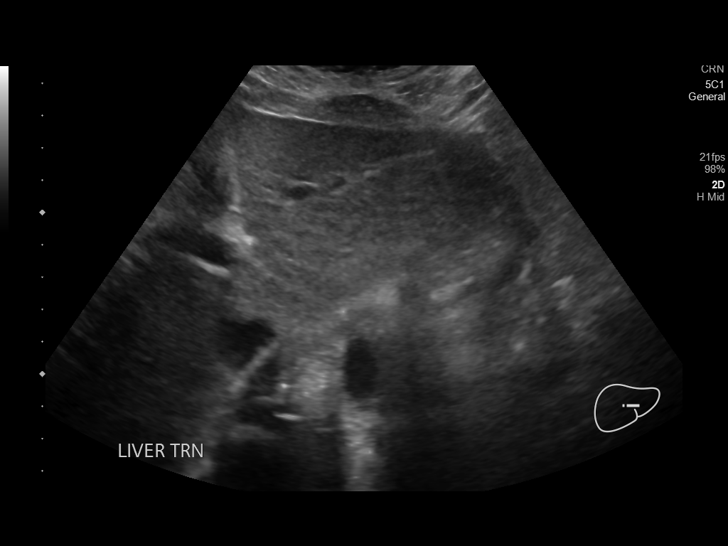
[im 38/102]
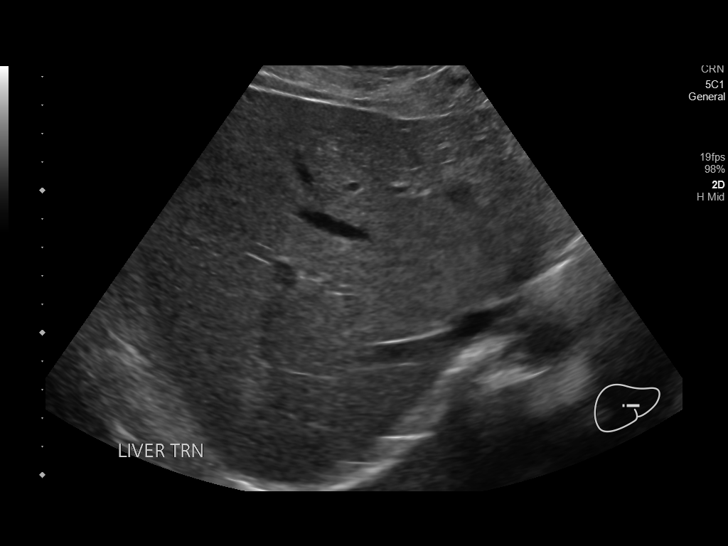
[im 47/102]
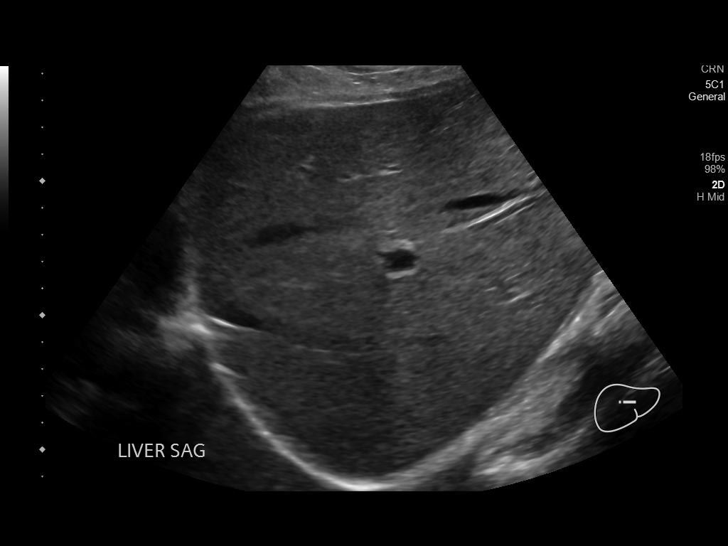
[im 55/102]
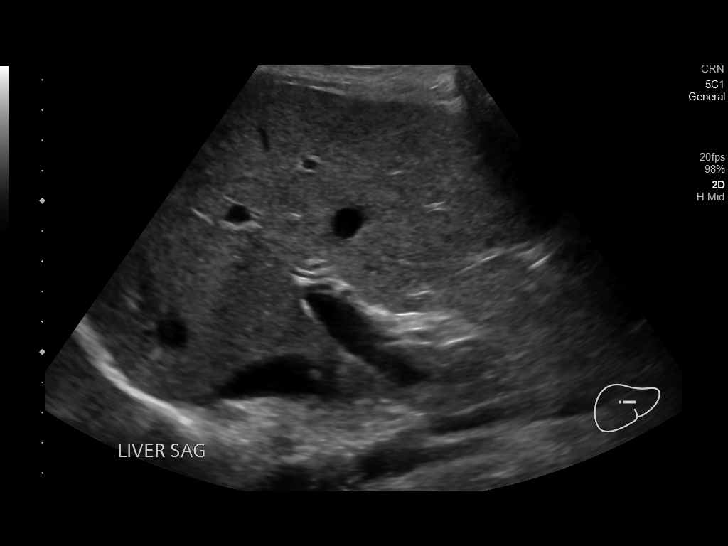
[im 64/102]
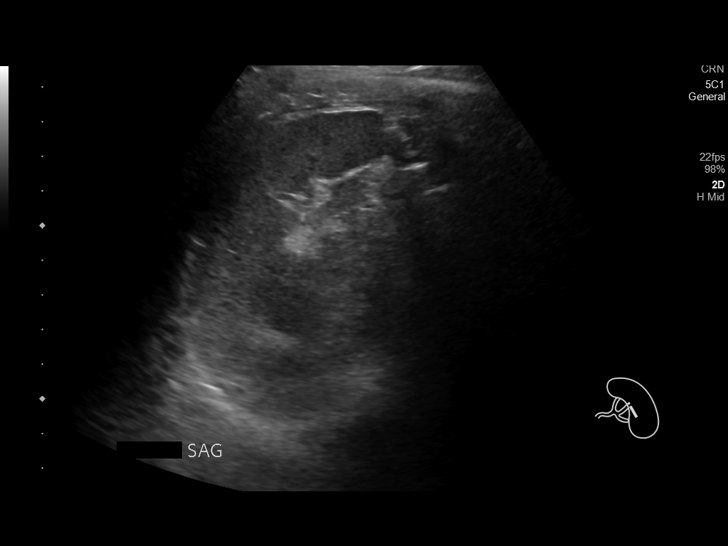
[im 68/102]
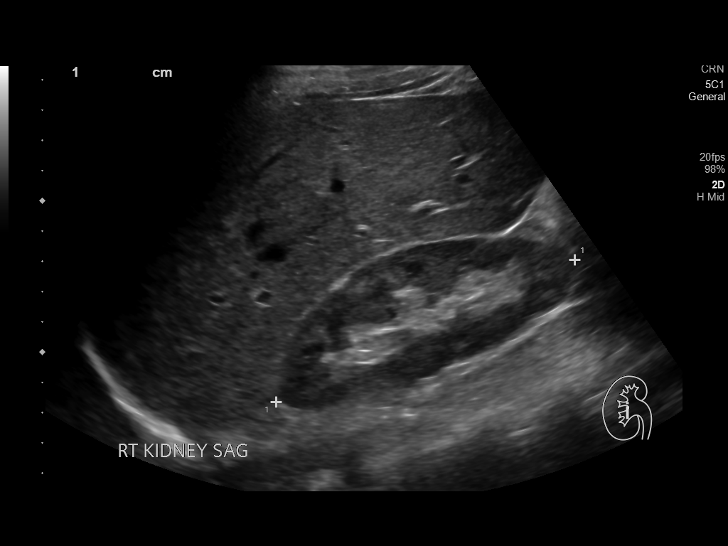
[im 76/102]
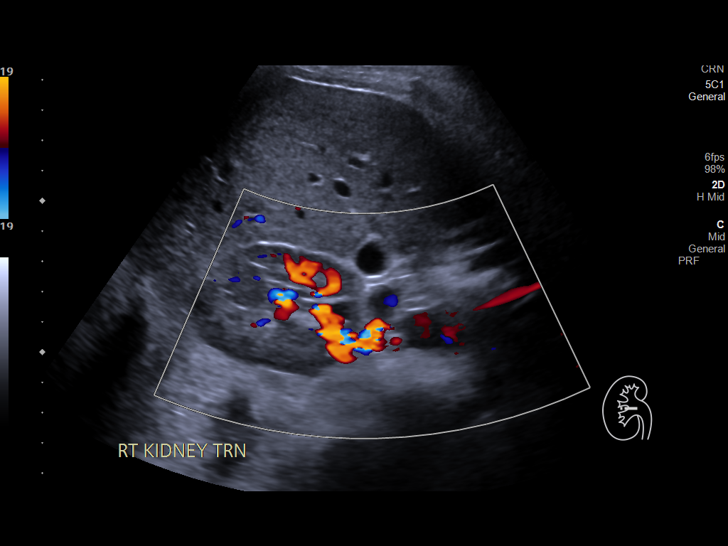
[im 85/102]
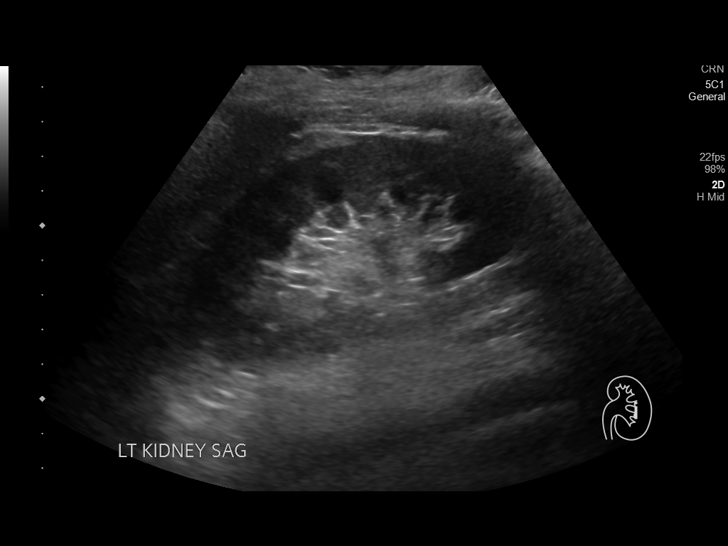
[im 93/102]
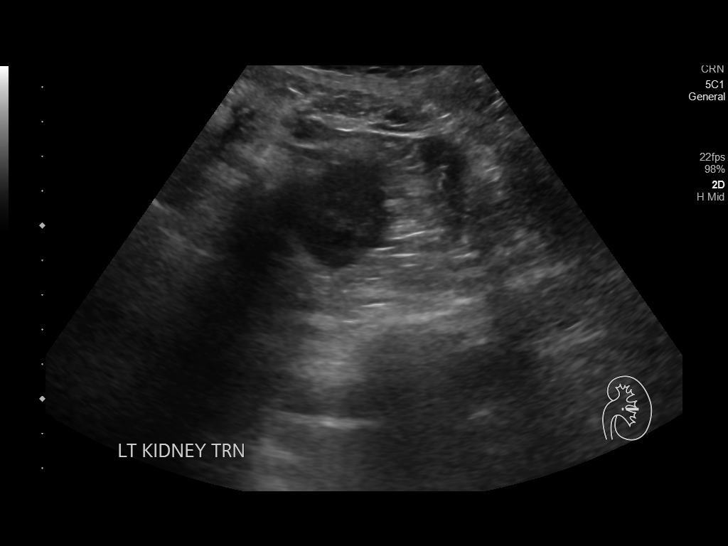
[im 102/102]
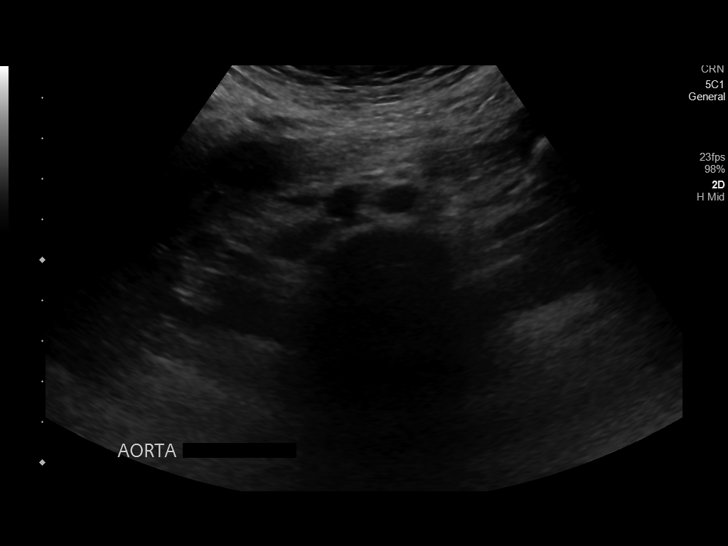

[14 of 25 positions shown; findings below may reference images not displayed]

FINDINGS: Gallbladder: No gallstones or wall thickening visualized. No
sonographic Murphy sign noted by sonographer.

Common bile duct: Diameter: 1.5 mm

Liver: No focal lesion identified. Within normal limits in
parenchymal echogenicity. Portal vein is patent on color Doppler
imaging with normal direction of blood flow towards the liver.

IVC: No abnormality visualized.

Pancreas: Visualized portion unremarkable.

Spleen: Size and appearance within normal limits.

Right Kidney: Length: 10.9 cm. Echogenicity within normal limits. No
mass or hydronephrosis visualized.

Left Kidney: Length: 10.7 cm. Echogenicity within normal limits. No
mass or hydronephrosis visualized.

Abdominal aorta: No aneurysm visualized.

Other findings: None.
IMPRESSION: No acute abnormality noted.
